# Patient Record
Sex: Male | Born: 1995 | Race: White | Hispanic: No | Marital: Single | Smoking: Never smoker
Health system: Southern US, Community
[De-identification: ages and names within clinical notes are randomized; demographics above are authoritative.]

## PROBLEM LIST (undated history)

## (undated) DIAGNOSIS — J45909 Unspecified asthma, uncomplicated: Secondary | ICD-10-CM

---

## 2018-12-17 ENCOUNTER — Other Ambulatory Visit: Payer: Self-pay | Admitting: *Deleted

## 2018-12-17 DIAGNOSIS — Z20822 Contact with and (suspected) exposure to covid-19: Secondary | ICD-10-CM

## 2018-12-18 LAB — NOVEL CORONAVIRUS, NAA: SARS-CoV-2, NAA: NOT DETECTED

## 2020-02-02 ENCOUNTER — Encounter: Payer: Self-pay | Admitting: Emergency Medicine

## 2020-02-02 ENCOUNTER — Emergency Department: Payer: PRIVATE HEALTH INSURANCE

## 2020-02-02 ENCOUNTER — Emergency Department
Admission: EM | Admit: 2020-02-02 | Discharge: 2020-02-02 | Disposition: A | Discharge status: Home / Self Care | Payer: PRIVATE HEALTH INSURANCE | Attending: Emergency Medicine | Admitting: Emergency Medicine

## 2020-02-02 ENCOUNTER — Ambulatory Visit
Admission: RE | Admit: 2020-02-02 | Discharge: 2020-02-02 | Disposition: A | Discharge status: Home / Self Care | Payer: PRIVATE HEALTH INSURANCE | Source: Ambulatory Visit

## 2020-02-02 ENCOUNTER — Other Ambulatory Visit: Payer: Self-pay

## 2020-02-02 DIAGNOSIS — R42 Dizziness and giddiness: Secondary | ICD-10-CM

## 2020-02-02 DIAGNOSIS — R5383 Other fatigue: Secondary | ICD-10-CM | POA: Diagnosis not present

## 2020-02-02 DIAGNOSIS — R0981 Nasal congestion: Secondary | ICD-10-CM | POA: Insufficient documentation

## 2020-02-02 DIAGNOSIS — Z20822 Contact with and (suspected) exposure to covid-19: Secondary | ICD-10-CM | POA: Diagnosis not present

## 2020-02-02 DIAGNOSIS — J45909 Unspecified asthma, uncomplicated: Secondary | ICD-10-CM | POA: Insufficient documentation

## 2020-02-02 DIAGNOSIS — R059 Cough, unspecified: Secondary | ICD-10-CM | POA: Insufficient documentation

## 2020-02-02 HISTORY — DX: Unspecified asthma, uncomplicated: J45.909

## 2020-02-02 LAB — HEPATIC FUNCTION PANEL
ALT: 27 U/L (ref 0–44)
AST: 38 U/L (ref 15–41)
Albumin: 4.4 g/dL (ref 3.5–5.0)
Alkaline Phosphatase: 67 U/L (ref 38–126)
Bilirubin, Direct: 0.1 mg/dL (ref 0.0–0.2)
Indirect Bilirubin: 1.3 mg/dL — ABNORMAL HIGH (ref 0.3–0.9)
Total Bilirubin: 1.4 mg/dL — ABNORMAL HIGH (ref 0.3–1.2)
Total Protein: 7.7 g/dL (ref 6.5–8.1)

## 2020-02-02 LAB — CBC
HCT: 45.1 % (ref 39.0–52.0)
Hemoglobin: 15.5 g/dL (ref 13.0–17.0)
MCH: 30 pg (ref 26.0–34.0)
MCHC: 34.4 g/dL (ref 30.0–36.0)
MCV: 87.4 fL (ref 80.0–100.0)
Platelets: 65 10*3/uL — ABNORMAL LOW (ref 150–400)
RBC: 5.16 MIL/uL (ref 4.22–5.81)
RDW: 12.6 % (ref 11.5–15.5)
WBC: 3.5 10*3/uL — ABNORMAL LOW (ref 4.0–10.5)
nRBC: 0 % (ref 0.0–0.2)

## 2020-02-02 LAB — URINALYSIS, COMPLETE (UACMP) WITH MICROSCOPIC
Bacteria, UA: NONE SEEN
Glucose, UA: NEGATIVE mg/dL
Hgb urine dipstick: NEGATIVE
Ketones, ur: NEGATIVE mg/dL
Leukocytes,Ua: NEGATIVE
Nitrite: NEGATIVE
Protein, ur: 30 mg/dL — AB
Specific Gravity, Urine: 1.032 — ABNORMAL HIGH (ref 1.005–1.030)
Squamous Epithelial / HPF: NONE SEEN (ref 0–5)
pH: 5 (ref 5.0–8.0)

## 2020-02-02 LAB — BASIC METABOLIC PANEL
Anion gap: 8 (ref 5–15)
BUN: 14 mg/dL (ref 6–20)
CO2: 26 mmol/L (ref 22–32)
Calcium: 8.9 mg/dL (ref 8.9–10.3)
Chloride: 102 mmol/L (ref 98–111)
Creatinine, Ser: 1.04 mg/dL (ref 0.61–1.24)
GFR, Estimated: >60 mL/min (ref >60)
Glucose, Bld: 90 mg/dL (ref 70–99)
Potassium: 3.3 mmol/L — ABNORMAL LOW (ref 3.5–5.1)
Sodium: 136 mmol/L (ref 135–145)

## 2020-02-02 LAB — RESPIRATORY PANEL BY RT PCR (FLU A&B, COVID)
Influenza A by PCR: NEGATIVE
Influenza B by PCR: NEGATIVE
SARS Coronavirus 2 by RT PCR: NEGATIVE

## 2020-02-02 LAB — TROPONIN I (HIGH SENSITIVITY): Troponin I (High Sensitivity): 3 ng/L (ref <18)

## 2020-02-02 MED ORDER — SODIUM CHLORIDE 0.9 % IV BOLUS
1000.0000 mL | Freq: Once | INTRAVENOUS | Status: AC
  Administered 2020-02-02: 1000 mL via INTRAVENOUS

## 2020-02-02 MED ORDER — KETOROLAC TROMETHAMINE 30 MG/ML IJ SOLN
30.0000 mg | Freq: Once | INTRAMUSCULAR | Status: AC
  Administered 2020-02-02: 30 mg via INTRAVENOUS
  Filled 2020-02-02: qty 1

## 2020-02-02 MED ORDER — ONDANSETRON HCL 4 MG/2ML IJ SOLN
4.0000 mg | Freq: Once | INTRAMUSCULAR | Status: DC
  Filled 2020-02-02: qty 2

## 2020-02-02 NOTE — ED Notes (Signed)
Pt to CT

## 2020-02-02 NOTE — ED Notes (Signed)
Patient is being discharged from the Urgent Care and sent to the Emergency Department via personal vehicle. Per Wendee Beavers, patient is in need of higher level of care due to head injury, need for further resources. Patient is aware and verbalizes understanding of plan of care. There were no vitals filed for this visit.

## 2020-02-02 NOTE — ED Provider Notes (Signed)
Patient received in signout from Dr. Lenard Lance pending covid test.  covid negative.  Noted low platelets and discussed with patient but no signs of petechia, rash, or bleeding.  No h/o tick bites.  Sounds like many of symptoms could be consistent with recent minor headinjury and possible concussion.  He feels much better after treatment here in the ER.  There was a documented low O2 sat but it is likely erroneous as he is not complaining any shortness of breath does not have any wheezing on exam and his O2 saturation is 100% on room air.  Will have patient follow-up with PCP for repeat blood work.  Patient agreeable to plan.  Mistreats understanding of signs and symptoms for which the patient should return to the ER.    Brian Eddy, MD 02/02/20 [phone]

## 2020-02-02 NOTE — Discharge Instructions (Addendum)
Please follow-up with PCP for repeat blood check as your platelet level was a little bit low.  Please return to the ER if you start having any worsening symptoms which would include persistent fevers, numbness, tingling, weakness, shortness of breath, nausea or vomiting.

## 2020-02-02 NOTE — ED Notes (Signed)
Sig pad not working. Patient verbalized understanding of all dc instructions and left with AVS

## 2020-02-02 NOTE — ED Triage Notes (Signed)
Pt comes into the ED via POV c/o dizziness.  Pt had syncopal episode on Monday and is still having lightheadedness.  Pt neurologically intact at this time and in NAD with even and unlabored respirations.  Pt denies any SHOB, CP, N/V.  Pt does admit that 2 mondays ago he hit his head and it has caused headaches and he also has had a cold which he thinks contributed to the syncopal episode and continued dizziness.

## 2020-02-02 NOTE — ED Notes (Signed)
Pt presents with complaints of head injury and possible syncope. Patient is alert and oriented. Wendee Beavers NP at bedside.

## 2020-02-02 NOTE — ED Triage Notes (Signed)
First Nurse Note:  Arrives from [facility] for ED evaluation.  Patient states he had a syncopal episode on Monday and continues to feel lightheaded.    Patient is AAOx3.  Skin warm and dry.  Ambulates with easy and steady gait.  MAE equally and strong.  NAD

## 2020-02-02 NOTE — ED Provider Notes (Signed)
[facility] Emergency Department Provider Note  Time seen: 2:06 PM  I have reviewed the triage vital signs and the nursing notes.   HISTORY  Chief Complaint Dizziness   HPI Brian Lindsey is a 24 y.o. male with a past medical history of asthma presents to the emergency department for generalized fatigue dizziness.   According to the patient approximately 5 days ago or so he began feeling somewhat weak with intermittent diarrhea lightheadedness.  States on Monday he had a syncopal event falling and hitting his head.  Since that time he has had worsening dizziness.  Patient states over the weekend he developed cold-like symptoms with congestion and a slight cough.  Patient is fully vaccinated as of April against COVID-19.  Denies any known fever.  No chest pain.  No shortness of breath.  Past Medical History:  Diagnosis Date  . Asthma     There are no problems to display for this patient.   History reviewed. No pertinent surgical history.  Prior to Admission medications   Not on File    No Known Allergies  History reviewed. No pertinent family history.  Social History Social History   Tobacco Use  . Smoking status: Never Smoker  . Smokeless tobacco: Never Used  Vaping Use  . Vaping Use: Never used  Substance Use Topics  . Alcohol use: Yes  . Drug use: Never    Review of Systems Constitutional: Negative for fever. ENT: Mild nasal congestion. Cardiovascular: Negative for chest pain. Respiratory: Negative for shortness of breath.  Mild cough. Gastrointestinal: Negative for abdominal pain Genitourinary: Negative for urinary compaints Musculoskeletal: Negative for musculoskeletal complaints Neurological: Negative for headache All other ROS negative  ____________________________________________   PHYSICAL EXAM:  VITAL SIGNS: ED Triage Vitals  Enc Vitals Group     BP 02/02/20 1256 131/74     Pulse Rate 02/02/20 1256 83     Resp  02/02/20 1256 16     Temp 02/02/20 1256 98.2 F (36.8 C)     Temp Source 02/02/20 1256 Oral     SpO2 02/02/20 1256 100 %     Weight 02/02/20 1255 170 lb (77.1 kg)     Height 02/02/20 1255 5\' 10"  (1.778 m)     Head Circumference --      Peak Flow --      Pain Score 02/02/20 1255 6     Pain Loc --      Pain Edu? --      Excl. in GC? --     Constitutional: Alert and oriented. Well appearing and in no distress. Eyes: Normal exam ENT      Head: Normocephalic and atraumatic.      Mouth/Throat: Mucous membranes are moist. Cardiovascular: Normal rate, regular rhythm.  Respiratory: Normal respiratory effort without tachypnea nor retractions. Breath sounds are clear  Gastrointestinal: Soft and nontender. No distention.  Musculoskeletal: Nontender with normal range of motion in all extremities Neurologic:  Normal speech and language. No gross focal neurologic deficits Skin:  Skin is warm, dry and intact.  Psychiatric: Mood and affect are normal.   ____________________________________________    EKG  EKG viewed and interpreted by myself shows a normal sinus rhythm 89 bpm with a narrow QRS, normal axis, normal intervals, no concerning ST changes.  ____________________________________________    RADIOLOGY  CT head negative  ____________________________________________   INITIAL IMPRESSION / ASSESSMENT AND PLAN / ED COURSE  Pertinent labs & imaging results that were available during my  care of the patient were reviewed by me and considered in my medical decision making (see chart for details).   Patient presents to the emergency department for dizziness lightheadedness cold-like symptoms.  Patient states symptoms have been ongoing for approximately 5 days or so but worse over the past 2 to 3 days.  Patient feels lightheaded at times.  Denies any known fever.  We will check a Covid test as precaution.  We will check labs, IV hydrate and obtain a CT scan of the head as a precaution.   Patient agreeable to plan of care.  CT scan head is negative your lab work does not appear to show any significant abnormalities.  Patient does have a slightly lower platelet count as well as white blood cell count but no old labs for comparison available.  Troponin is negative.  CT scan of the head reassuring.  EKG is reassuring.  Vitals are reassuring.  Patient is receiving IV fluids overall appears well.  Covid test is pending.  If Covid is negative I would anticipate likely discharge home with supportive care PCP follow-up for recheck of labs in 1 week.  Patient care signed out to oncoming MD  Brian Lindsey was evaluated in Emergency Department on 02/02/2020 for the symptoms described in the history of present illness. He was evaluated in the context of the global COVID-19 pandemic, which necessitated consideration that the patient might be at risk for infection with the SARS-CoV-2 virus that causes COVID-19. Institutional protocols and algorithms that pertain to the evaluation of patients at risk for COVID-19 are in a state of rapid change based on information released by regulatory bodies including the CDC and federal and state organizations. These policies and algorithms were followed during the patient's care in the ED.  ____________________________________________   FINAL CLINICAL IMPRESSION(S) / ED DIAGNOSES  Dizziness   Minna Antis, MD 02/02/20 1457

## 2021-03-13 IMAGING — CT CT HEAD W/O CM
4 series · 16 of 47 positions shown, 18 images · non-contrast
Comparison: None.

CLINICAL DATA: Mental status change.  Syncope.  Minor head injury.

EXAM:
CT HEAD WITHOUT CONTRAST
TECHNIQUE: Contiguous axial images were obtained from the base of the skull
through the vertex without intravenous contrast.

[Series 2: head wo · axial · 0.45mm/px · z∈[+1029,+1134]mm · 6 of 31 slices shown, 8 images]
[im 5/31  brain]
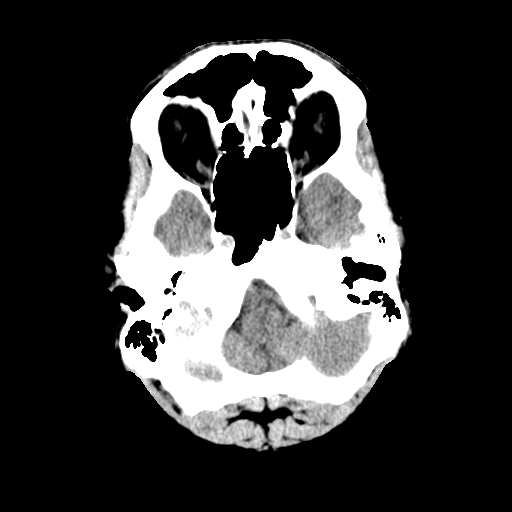
[im 5/31  bone]
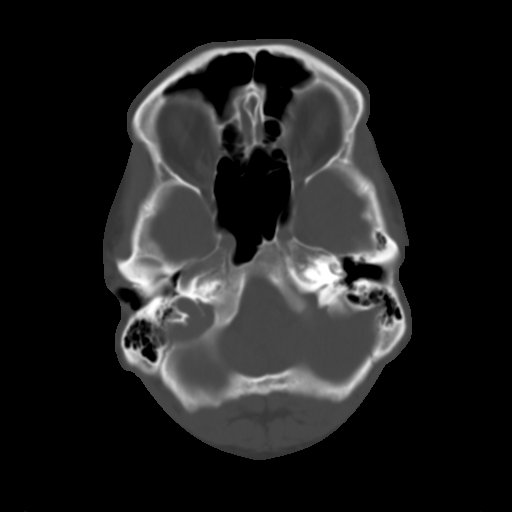
[im 9/31  brain]
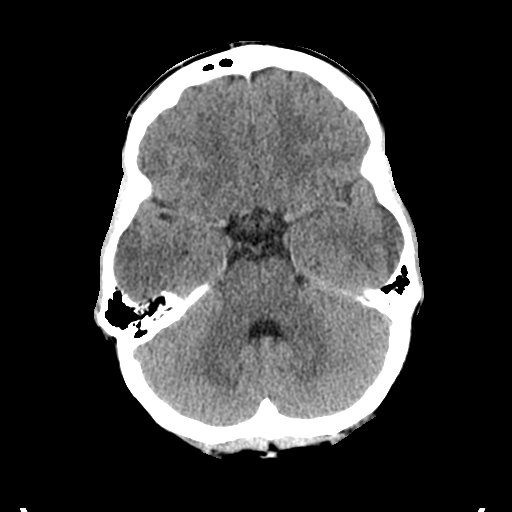
[im 13/31  brain]
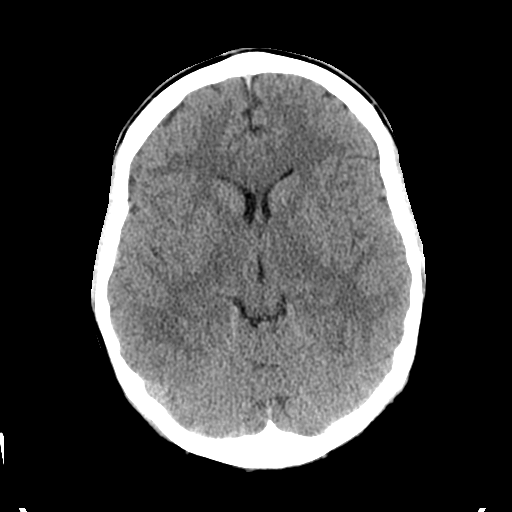
[im 18/31  brain]
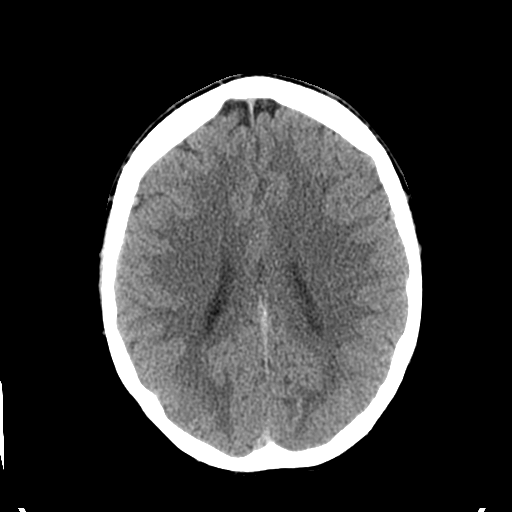
[im 22/31  brain]
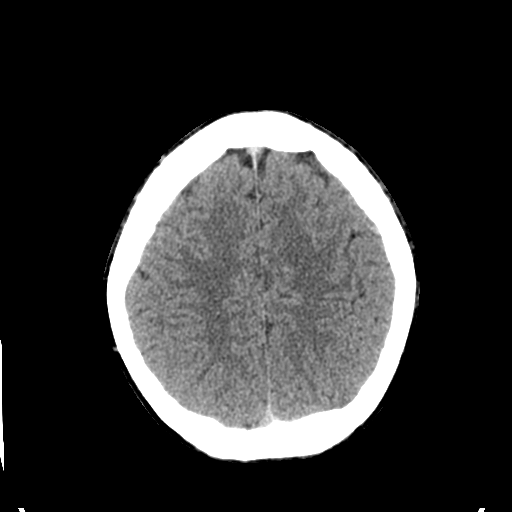
[im 22/31  bone]
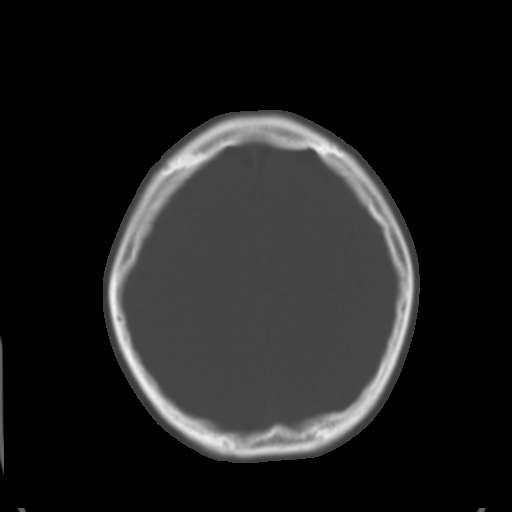
[im 26/31  brain]
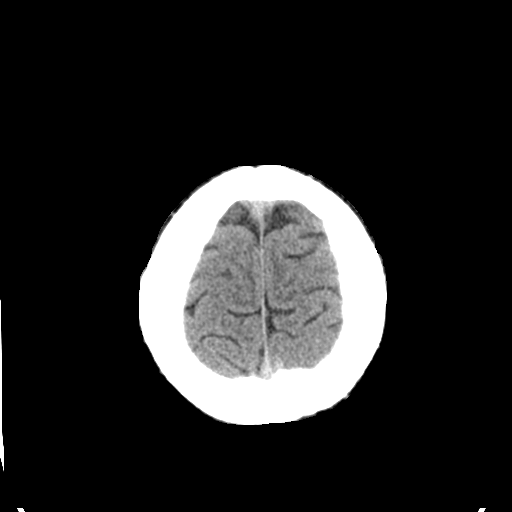

[Series 3: head bone · axial · 0.45mm/px · z∈[+1023,+1075]mm · 4 of 79 slices shown]
[im 8/79  bone]
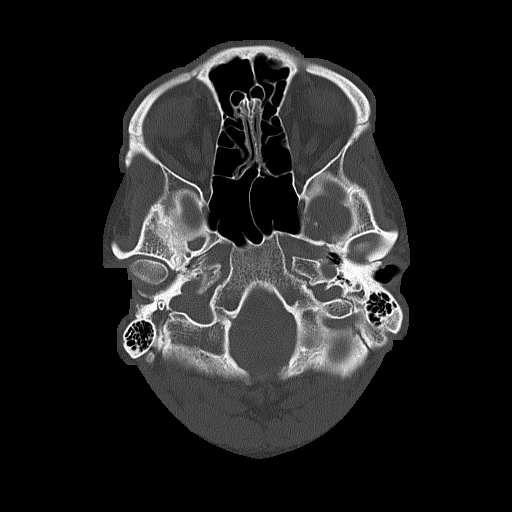
[im 15/79  bone]
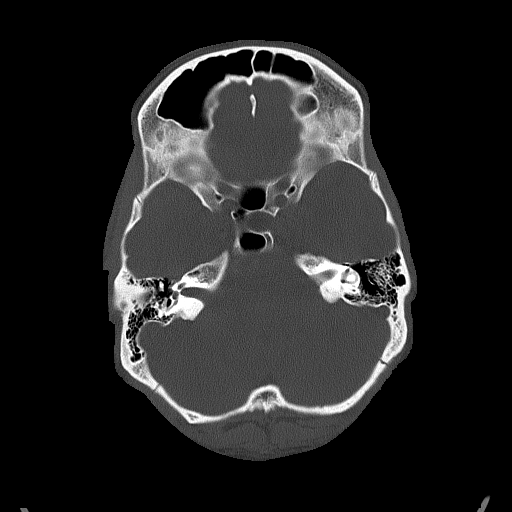
[im 27/79  bone]
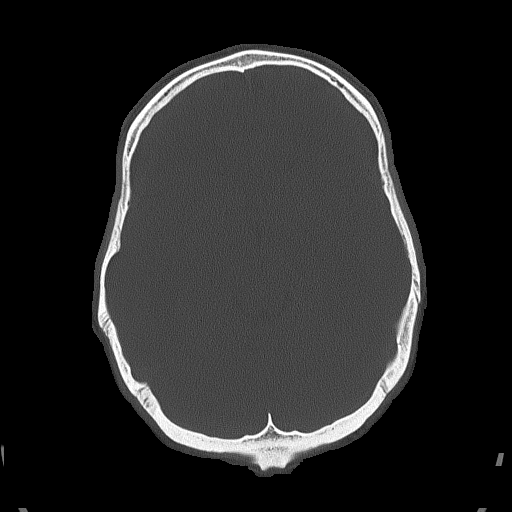
[im 34/79  bone]
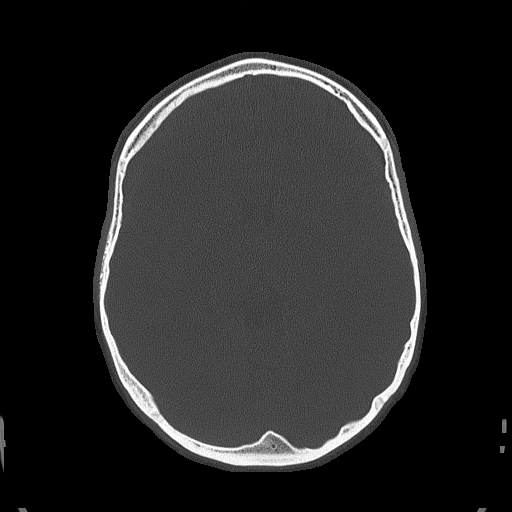

[Series 4: coronal soft tissue · coronal · 0.31mm/px · 3 of 62 slices shown]
[im 21/62  brain]
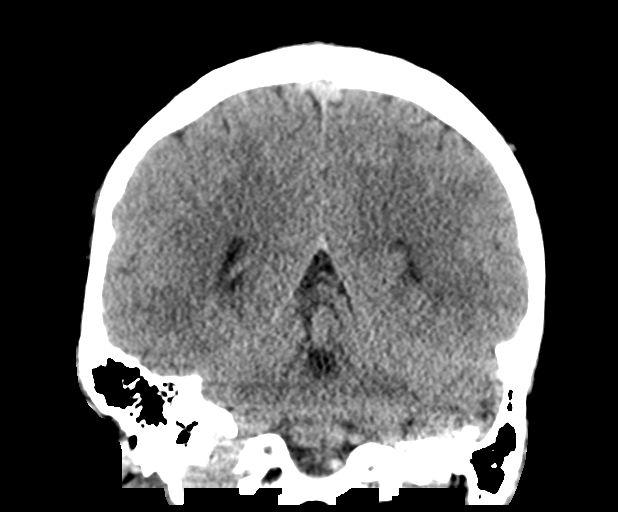
[im 28/62  brain]
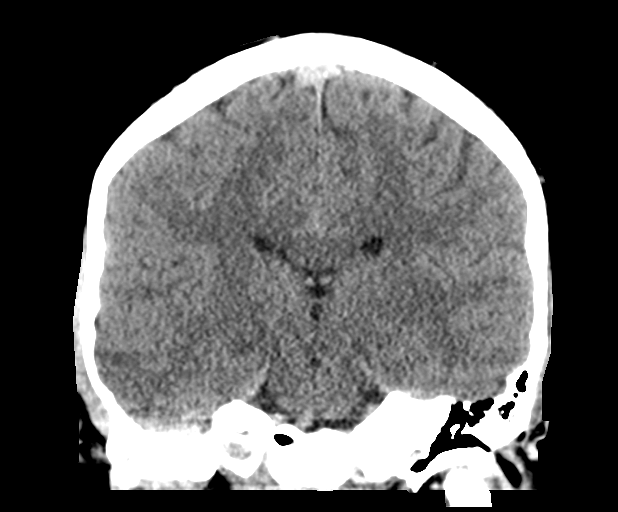
[im 34/62  brain]
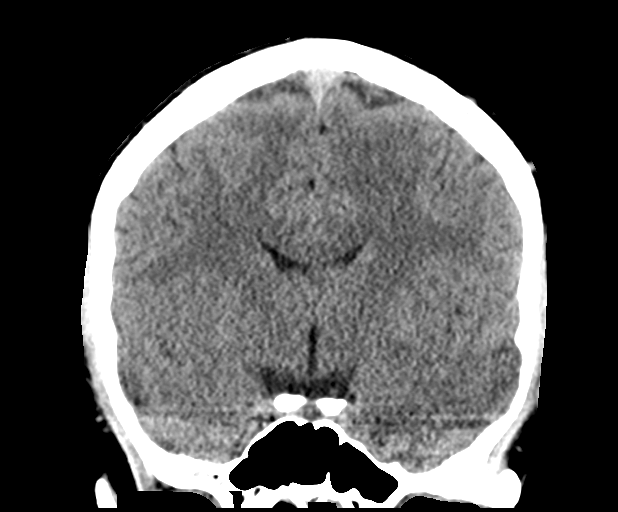

[Series 5: sagittal soft tissue · sagittal · 0.31mm/px · 3 of 55 slices shown]
[im 19/55  brain]
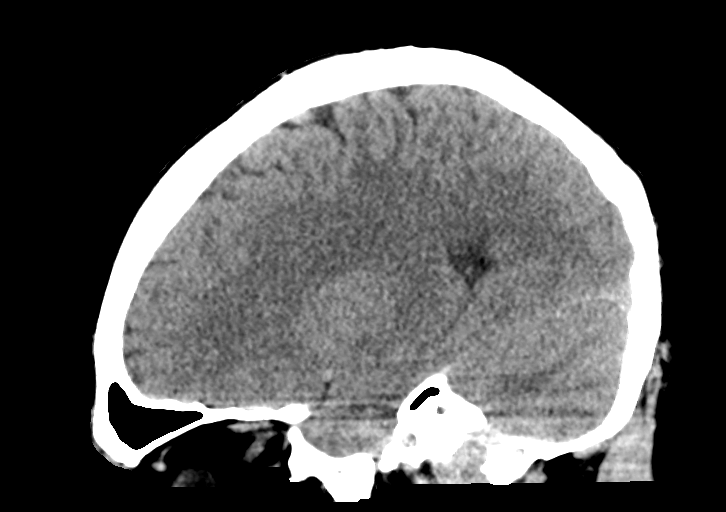
[im 28/55  brain]
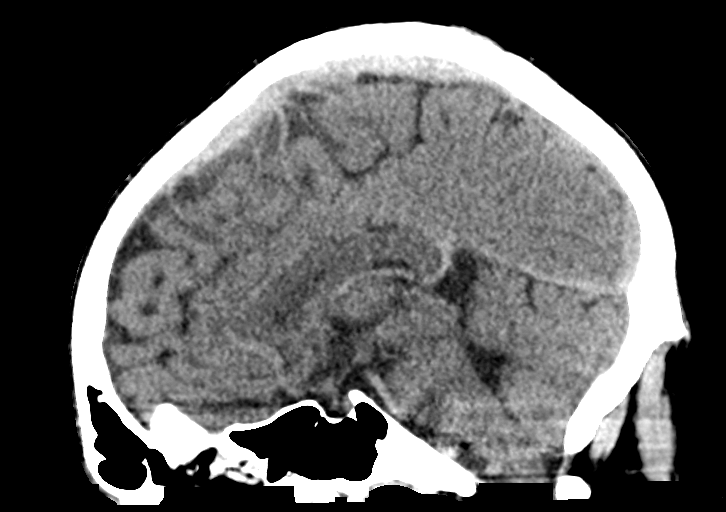
[im 37/55  brain]
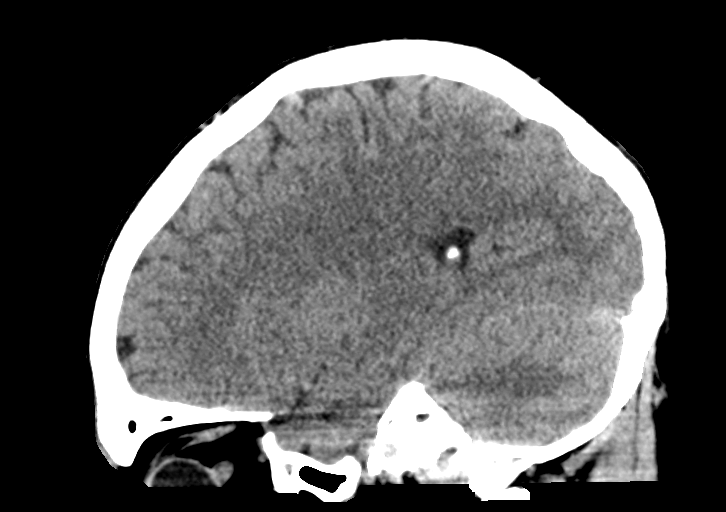

[16 of 47 positions shown; findings below may reference images not displayed]

FINDINGS: Brain: No evidence of acute infarction, hemorrhage, hydrocephalus,
extra-axial collection or mass lesion/mass effect.

Vascular: Negative for hyperdense vessel

Skull: Negative

Sinuses/Orbits: Paranasal sinuses clear.  Negative orbit

Other: None
IMPRESSION: Negative CT head
